# Patient Record
Sex: Male | Born: 1990
Health system: Southern US, Community
[De-identification: ages and names within clinical notes are randomized; demographics above are authoritative.]

## PROBLEM LIST (undated history)

## (undated) HISTORY — PX: KNEE SURGERY: SHX244

## (undated) HISTORY — PX: EYE SURGERY: SHX253

---
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
## 2010-05-19 LAB — CBC
HCT: 41.1 % (ref 39.0–52.0)
Hemoglobin: 14.3 g/dL (ref 13.0–17.0)
MCHC: 34.7 g/dL (ref 30.0–36.0)
MCV: 85.1 fL (ref 78.0–100.0)
Platelets: 178 10*3/uL (ref 150–400)
RBC: 4.84 MIL/uL (ref 4.22–5.81)
RDW: 13.1 % (ref 11.5–15.5)
WBC: 7.3 10*3/uL (ref 4.0–10.5)

## 2010-05-19 LAB — PROTIME-INR
INR: 1.16 (ref 0.00–1.49)
Prothrombin Time: 14.7 seconds (ref 11.6–15.2)

## 2016-06-10 DIAGNOSIS — J0100 Acute maxillary sinusitis, unspecified: Secondary | ICD-10-CM | POA: Diagnosis not present

## 2016-06-10 DIAGNOSIS — J029 Acute pharyngitis, unspecified: Secondary | ICD-10-CM | POA: Diagnosis not present

## 2017-03-26 DIAGNOSIS — J0100 Acute maxillary sinusitis, unspecified: Secondary | ICD-10-CM | POA: Diagnosis not present

## 2018-05-12 ENCOUNTER — Telehealth: Payer: Self-pay | Admitting: Physician Assistant

## 2018-05-12 DIAGNOSIS — M549 Dorsalgia, unspecified: Secondary | ICD-10-CM

## 2018-05-12 DIAGNOSIS — R509 Fever, unspecified: Secondary | ICD-10-CM

## 2018-05-12 NOTE — Progress Notes (Signed)
Based on what you shared with me, I feel your condition warrants further evaluation and I recommend that you be seen for a face to face office visit.  Giving the high fever along with back pain, this is not classic for flu (generalized aches, chills, sore throat, headache with fever). This needs assessment in person. Please do not delay care.     NOTE: If you entered your credit card information for this eVisit, you will not be charged. You may see a "hold" on your card for the $35 but that hold will drop off and you will not have a charge processed.  If you are having a true medical emergency please call 911.  If you need an urgent face to face visit, [place] has four urgent care centers for your convenience.    PLEASE NOTE: THE [facility] LOCATIONS AND URGENT CARE CLINICS DO NOT HAVE THE TESTING FOR CORONAVIRUS COVID19 AVAILABLE.  IF YOU FEEL YOU NEED THIS TEST YOU MUST HAVE AN ORDER TO GO TO A TESTING LOCATION FROM YOUR PROVIDER OR FROM A SCREENING E-VISIT     [email or web address] to reserve your spot online an avoid wait times  [facility] [address] [place], [place] [address] 8 am to 8 pm Monday-Friday 10 am to 4 pm Saturday-Sunday *Across the street from [employer]  [address] [place], [address] 8 am to 5 pm Monday-Friday * In the [facility] on the [facility]   The following sites will take your insurance:  . [facility]  [phone] Get Driving Directions Find a Provider at this Location  [address] [place], [place] [address] . 10 am to 8 pm Monday-Friday . 12 pm to 8 pm Saturday-Sunday   . [facility] at [facility]  [phone] Get Driving Directions Find a Provider at this Location  [address], [address] [place], [place] [address] . 8 am to 8 pm Monday-Friday . 9 am to 6 pm Saturday . 11 am to 6 pm Sunday   . [facility]  Urgent Care at [facility]  [phone] Get Driving Directions  [address] [place], [place] [address] . 8 am to 8 pm Monday-Friday . 8 am to 4 pm Saturday-Sunday   Your e-visit answers were reviewed by a board certified advanced clinical practitioner to complete your personal care plan.  Thank you for using e-Visits.

## 2018-09-23 ENCOUNTER — Encounter (HOSPITAL_BASED_OUTPATIENT_CLINIC_OR_DEPARTMENT_OTHER): Payer: Self-pay

## 2018-09-23 ENCOUNTER — Emergency Department (HOSPITAL_BASED_OUTPATIENT_CLINIC_OR_DEPARTMENT_OTHER)
Admission: EM | Admit: 2018-09-23 | Discharge: 2018-09-23 | Disposition: A | Discharge status: Home / Self Care | Payer: No Typology Code available for payment source | Attending: Emergency Medicine | Admitting: Emergency Medicine

## 2018-09-23 ENCOUNTER — Other Ambulatory Visit: Payer: Self-pay

## 2018-09-23 DIAGNOSIS — Y9389 Activity, other specified: Secondary | ICD-10-CM | POA: Insufficient documentation

## 2018-09-23 DIAGNOSIS — Y990 Civilian activity done for income or pay: Secondary | ICD-10-CM | POA: Diagnosis not present

## 2018-09-23 DIAGNOSIS — Y9289 Other specified places as the place of occurrence of the external cause: Secondary | ICD-10-CM | POA: Insufficient documentation

## 2018-09-23 DIAGNOSIS — M542 Cervicalgia: Secondary | ICD-10-CM | POA: Diagnosis present

## 2018-09-23 MED ORDER — IBUPROFEN 800 MG PO TABS
800.0000 mg | ORAL_TABLET | Freq: Once | ORAL | Status: AC
  Administered 2018-09-23: 800 mg via ORAL
  Filled 2018-09-23: qty 1

## 2018-09-23 MED ORDER — CYCLOBENZAPRINE HCL 10 MG PO TABS: 10.0000 mg | ORAL_TABLET | Freq: Two times a day (BID) | ORAL | 0 refills | Status: AC | PRN
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
## 2020-04-30 ENCOUNTER — Emergency Department (HOSPITAL_COMMUNITY)
Admission: EM | Admit: 2020-04-30 | Discharge: 2020-04-30 | Disposition: A | Discharge status: Home / Self Care | Payer: 59 | Attending: Emergency Medicine | Admitting: Emergency Medicine

## 2020-04-30 ENCOUNTER — Encounter (HOSPITAL_COMMUNITY): Payer: Self-pay

## 2020-04-30 ENCOUNTER — Other Ambulatory Visit: Payer: Self-pay

## 2020-04-30 ENCOUNTER — Emergency Department (HOSPITAL_COMMUNITY): Payer: 59

## 2020-04-30 DIAGNOSIS — N50812 Left testicular pain: Secondary | ICD-10-CM | POA: Diagnosis present

## 2020-04-30 DIAGNOSIS — N433 Hydrocele, unspecified: Secondary | ICD-10-CM | POA: Diagnosis not present

## 2020-04-30 LAB — URINALYSIS, ROUTINE W REFLEX MICROSCOPIC
Bilirubin Urine: NEGATIVE
Glucose, UA: NEGATIVE mg/dL
Hgb urine dipstick: NEGATIVE
Ketones, ur: NEGATIVE mg/dL
Leukocytes,Ua: NEGATIVE
Nitrite: NEGATIVE
Protein, ur: NEGATIVE mg/dL
Specific Gravity, Urine: 1.025 (ref 1.005–1.030)
pH: 7 (ref 5.0–8.0)

## 2020-04-30 NOTE — ED Provider Notes (Signed)
[facility]-EMERGENCY DEPT Provider Note   CSN: [id number] Arrival date & time: 04/30/20  1820     History Chief Complaint  Patient presents with  . Testicle Pain    Steve Sanders is a 30 y.o. male.  HPI Pt is a 30 year old male that presents to the ED due to left testicle pain.  Symptoms started about 6 days ago.  He states that they happen briefly in the evening and then dissipated overnight.  2 days later he had another episode during the day that spontaneously alleviated shortly thereafter.  Earlier today he began experiencing a similar episode that he states has been progressively worsening throughout the day.  He called his regular doctor who recommended that he come to the emergency department for an ultrasound to rule out torsion.  States he is married and in a monogamous relationship.  Denies any concern for STI.  Denies any fevers, chills, dysuria, hematuria, penile discharge, penile pain.  No scrotal swelling.    History reviewed. No pertinent past medical history.  There are no problems to display for this patient.   Past Surgical History:  Procedure Laterality Date  . EYE SURGERY    . KNEE SURGERY         Family History  Problem Relation Age of Onset  . Healthy Mother     Social History   Tobacco Use  . Smoking status: Never Smoker  . Smokeless tobacco: Never Used  Vaping Use  . Vaping Use: Never used  Substance Use Topics  . Alcohol use: Yes    Comment: occ  . Drug use: Never    Home Medications Prior to Admission medications   Medication Sig Start Date End Date Taking? Authorizing Provider  cyclobenzaprine (FLEXERIL) 10 MG tablet Take 1 tablet (10 mg total) by mouth 2 (two) times daily as needed for muscle spasms. 09/23/18   Shanon Ace, PA-C    Allergies    Patient has no known allergies.  Review of Systems   Review of Systems  All other systems reviewed and are negative. Ten systems reviewed and are  negative for acute change, except as noted in the HPI.    Physical Exam Updated Vital Signs BP 127/88   Pulse 72   Temp 98.4 F (36.9 C) (Oral)   Resp 19   Ht 5\' 5"  (1.651 m)   Wt 72.6 kg   SpO2 100%   BMI 26.63 kg/m   Physical Exam Vitals and nursing note reviewed.  Constitutional:      General: He is not in acute distress.    Appearance: He is well-developed.  HENT:     Head: Normocephalic and atraumatic.     Right Ear: External ear normal.     Left Ear: External ear normal.  Eyes:     General: No scleral icterus.       Right eye: No discharge.        Left eye: No discharge.     Conjunctiva/sclera: Conjunctivae normal.  Neck:     Trachea: No tracheal deviation.  Cardiovascular:     Rate and Rhythm: Normal rate.  Pulmonary:     Effort: Pulmonary effort is normal. No respiratory distress.     Breath sounds: No stridor.  Abdominal:     General: There is no distension.  Genitourinary:    Comments: Male nursing chaperone present. Normal appearing circumcised penis.  No tenderness appreciated along the penile shaft.  Mild tenderness diffusely along  the left testicle.  No overlying skin changes or swelling.  No epididymal tenderness noted bilaterally.  No right testicular tenderness. Cremasteric reflex intact. No palpable hernias.  Musculoskeletal:        General: No swelling or deformity.     Cervical back: Neck supple.  Skin:    General: Skin is warm and dry.     Findings: No rash.  Neurological:     Mental Status: He is alert.     Cranial Nerves: Cranial nerve deficit: no gross deficits.    ED Results / Procedures / Treatments   Labs (all labs ordered are listed, but only abnormal results are displayed) Labs Reviewed  URINALYSIS, ROUTINE W REFLEX MICROSCOPIC - Abnormal; Notable for the following components:      Result Value   APPearance CLOUDY (*)    All other components within normal limits  GC/CHLAMYDIA PROBE AMP ([place]) NOT AT [facility]    EKG None  Radiology US SCROTUM W/DOPPLER  Result Date: 04/30/2020 CLINICAL DATA:  Left testicular pain, rule out torsion. EXAM: SCROTAL ULTRASOUND DOPPLER ULTRASOUND OF THE TESTICLES TECHNIQUE: Complete ultrasound examination of the testicles, epididymis, and other scrotal structures was performed. Color and spectral Doppler ultrasound were also utilized to evaluate blood flow to the testicles. COMPARISON:  None. FINDINGS: Right testicle Measurements: 3.5 x 2.4 x 2.4 cm. No mass or microlithiasis visualized. Left testicle Measurements: 3.5 x 2.3 x 2.3 cm. No mass or microlithiasis visualized. Right epididymis:  Normal in size and appearance. Left epididymis:  Normal in size and appearance. Hydrocele: Bilateral small simple hydroceles, right greater than left. Varicocele:  None visualized. Pulsed Doppler interrogation of both testes demonstrates normal low resistance arterial and venous waveforms bilaterally. IMPRESSION: 1. Bilateral small hydroceles. Otherwise unremarkable scrotal ultrasound. 2. Please note testicular torsion is not fully excluded as this can be an intermittent finding. Electronically Signed   By: Tish Frederickson M.D.   On: 04/30/2020 19:55    Procedures Procedures   Medications Ordered in ED Medications - No data to display  ED Course  I have reviewed the triage vital signs and the nursing notes.  Pertinent labs & imaging results that were available during my care of the patient were reviewed by me and considered in my medical decision making (see chart for details).    MDM Rules/Calculators/A&P                          Pt is a 30 y.o. male who presents to the ED with left testicle pain.   Imaging: U/S with doppler of the scrotum shows small bilateral hydroceles, right greater than left. Otherwise, unremarkable exam.   I, Placido Sou, PA-C, personally reviewed and evaluated these images as part of my medical decision-making.  Unsure of the cause of the  patient's pain. U/S reassuring. Patient was having his worst pain while in the ED and during the sonographers exam, so doubt this was torsion that momentarily resolved while in the ED. He does have small uncomplicated bilateral hydroceles. He had very mild TTP to the left testicle but otherwise had a normal appearing exam. UA reassuring. GC/chlamydia pending but pt in monogamous relationship and denies any urinary sx, GU sx, or concern for STI. Doubt his pain is 2/2 infection. Recommended he try wearing more supportive underwear for the next few weeks to see if this helps with his sx. He is going to f/u with his PCP and also has a urology appt  later this month. He knows to return to the ED with worsening sx. His questions were answered and he was amicable at the time of d/c.   Note: Portions of this report may have been transcribed using voice recognition software. Every effort was made to ensure accuracy; however, inadvertent computerized transcription errors may be present.   Final Clinical Impression(s) / ED Diagnoses Final diagnoses:  Left testicular pain  Hydrocele, unspecified hydrocele type   Rx / DC Orders ED Discharge Orders    None       Placido Sou, PA-C 05/01/20 1039    Bethann Berkshire, MD 05/02/20 0945

## 2020-04-30 NOTE — Discharge Instructions (Signed)
Like we discussed, please consider wearing more supportive underwear for the next 1 to 2 weeks to see if this helps alleviate your symptoms.  If your symptoms persist, please follow-up with your regular doctor.  Also, please make sure that you follow-up with urology later this month.  If your symptoms worsen, please return to the emergency department immediately for reevaluation.  It was a pleasure to meet you.

## 2020-04-30 NOTE — ED Triage Notes (Signed)
Patient c/o left testicle pain x 4 days and worse today. Patient denies any swelling

## 2020-05-02 LAB — GC/CHLAMYDIA PROBE AMP (~~LOC~~) NOT AT ARMC
Chlamydia: NEGATIVE
Comment: NEGATIVE
Comment: NORMAL
Neisseria Gonorrhea: NEGATIVE

## 2020-12-26 ENCOUNTER — Encounter (HOSPITAL_COMMUNITY): Payer: Self-pay

## 2022-04-04 ENCOUNTER — Emergency Department
Admission: EM | Admit: 2022-04-04 | Discharge: 2022-04-04 | Disposition: A | Discharge status: Home / Self Care | Payer: 59 | Attending: Student in an Organized Health Care Education/Training Program | Admitting: Student in an Organized Health Care Education/Training Program

## 2022-04-04 ENCOUNTER — Emergency Department: Payer: 59

## 2022-04-04 DIAGNOSIS — S0990XA Unspecified injury of head, initial encounter: Secondary | ICD-10-CM | POA: Diagnosis present

## 2022-04-04 DIAGNOSIS — W01198A Fall on same level from slipping, tripping and stumbling with subsequent striking against other object, initial encounter: Secondary | ICD-10-CM | POA: Diagnosis not present
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
## 2022-04-04 NOTE — Discharge Instructions (Signed)
Alternate Tylenol and ibuprofen as needed for headache and neck discomfort.
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
## 2022-10-04 IMAGING — US US SCROTUM W/ DOPPLER COMPLETE
1 series · 14 of 25 positions shown · non-contrast
Comparison: None.

CLINICAL DATA: Left testicular pain, rule out torsion.

EXAM:
SCROTAL ULTRASOUND
DOPPLER ULTRASOUND OF THE TESTICLES
TECHNIQUE: Complete ultrasound examination of the testicles, epididymis, and
other scrotal structures was performed. Color and spectral Doppler
ultrasound were also utilized to evaluate blood flow to the
testicles.

[Series 1: us scrotum w/ doppler complete · 14 of 53 slices shown]
[im 1/53]
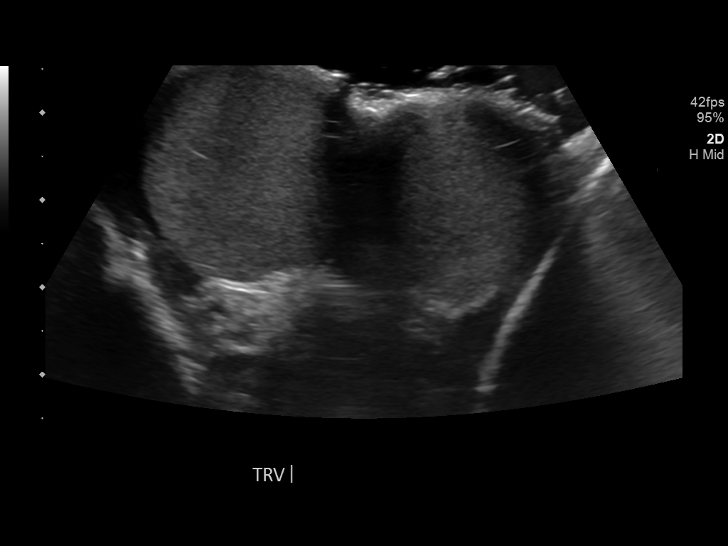
[im 5/53]
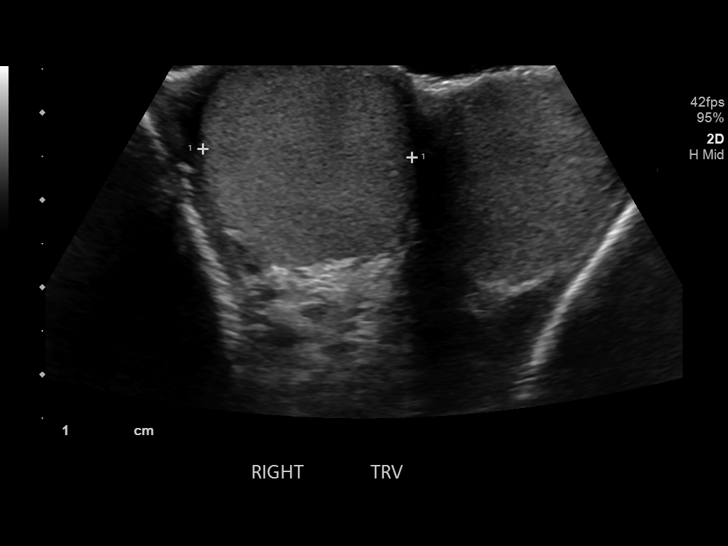
[im 9/53]
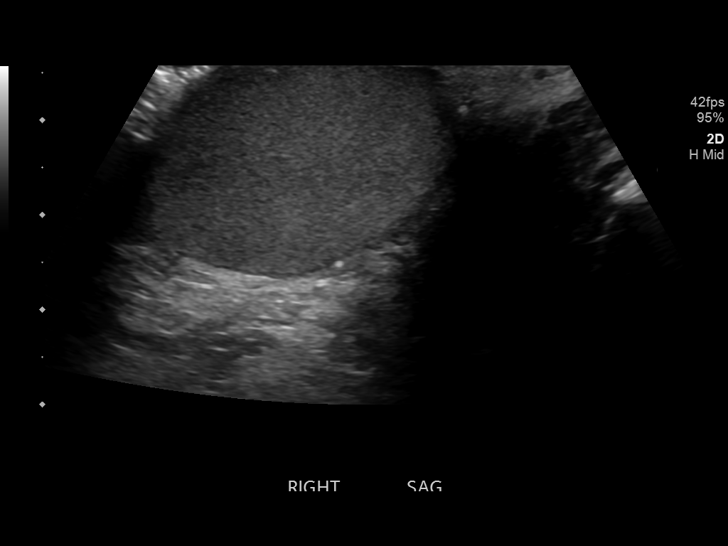
[im 14/53]
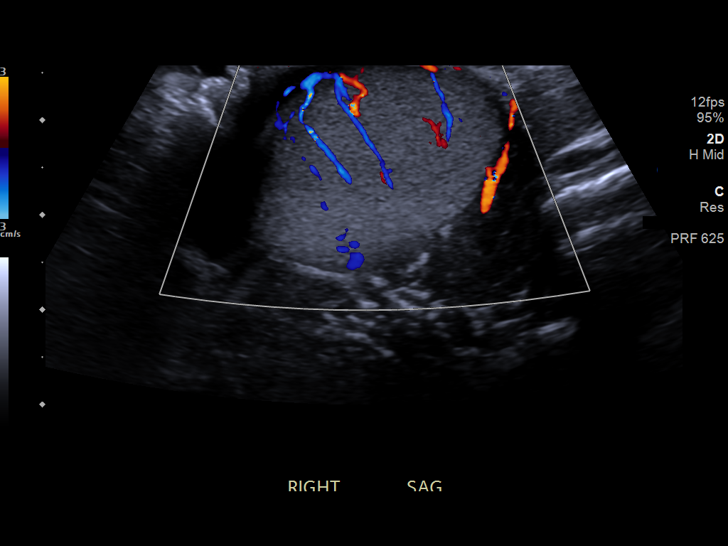
[im 18/53]
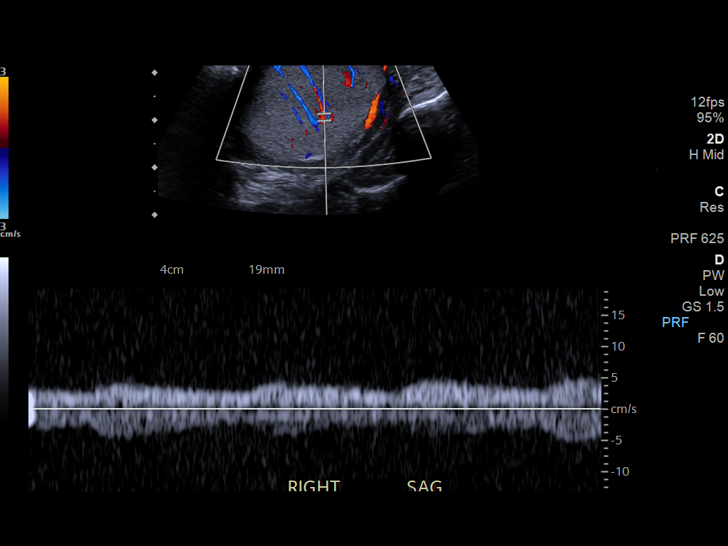
[im 20/53]
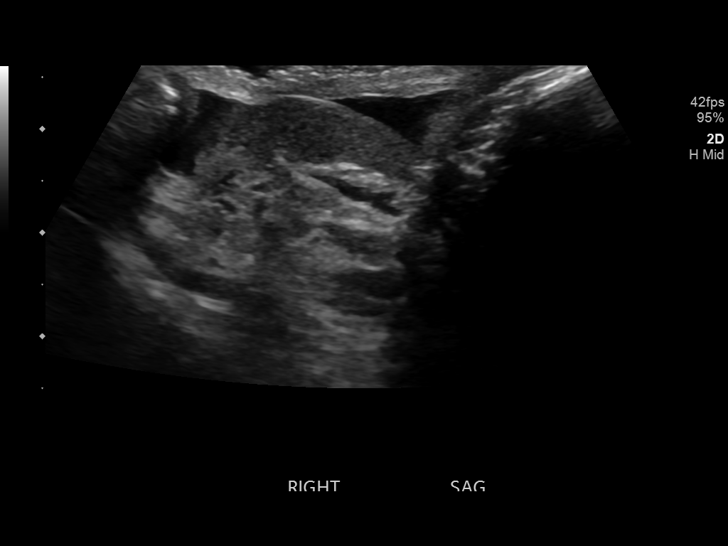
[im 24/53]
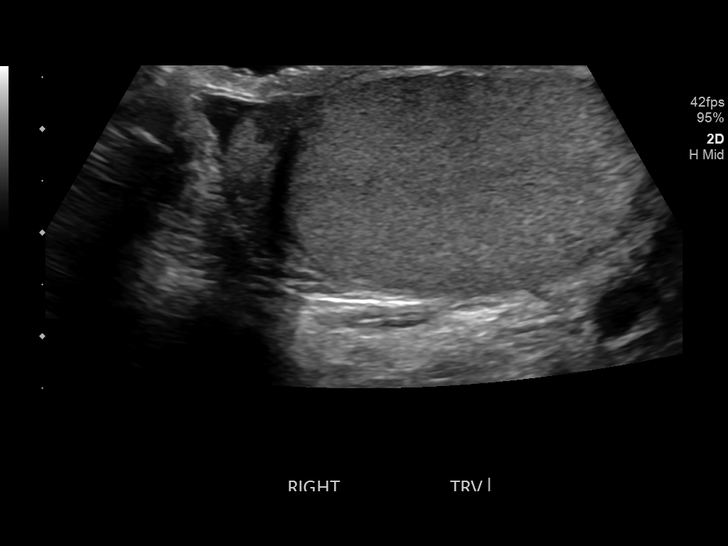
[im 29/53]
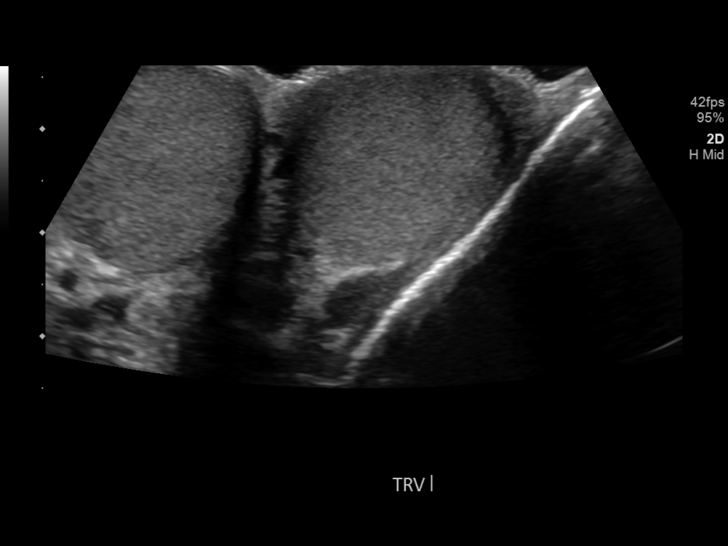
[im 33/53]
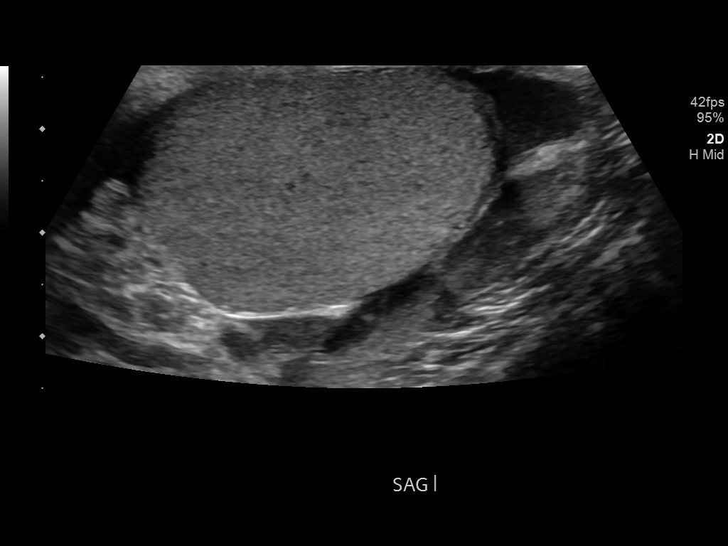
[im 35/53]
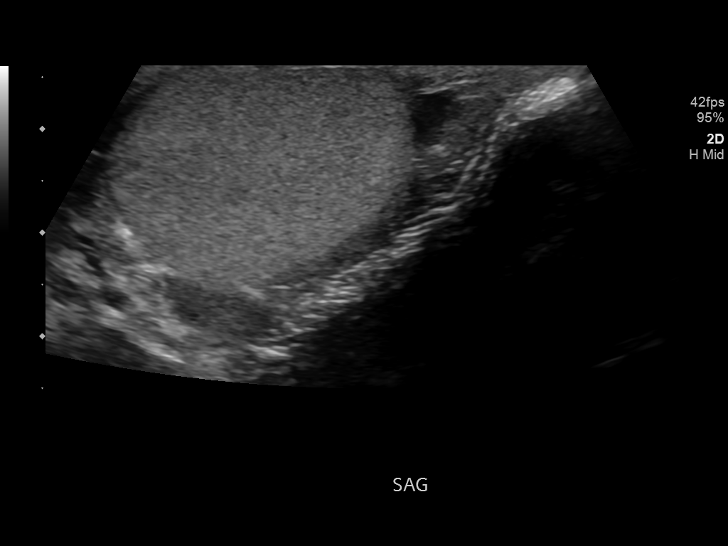
[im 40/53]
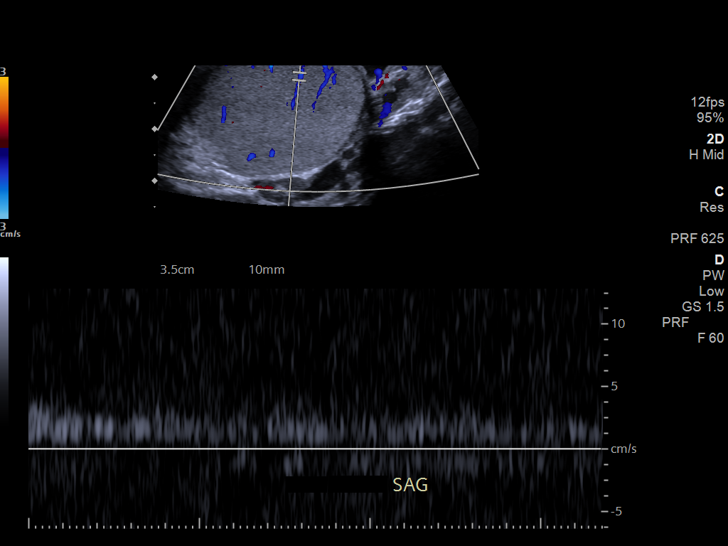
[im 44/53]
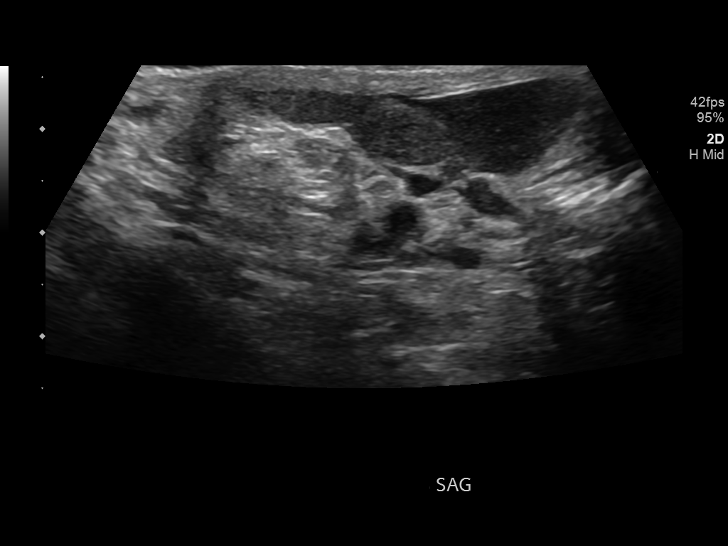
[im 48/53]
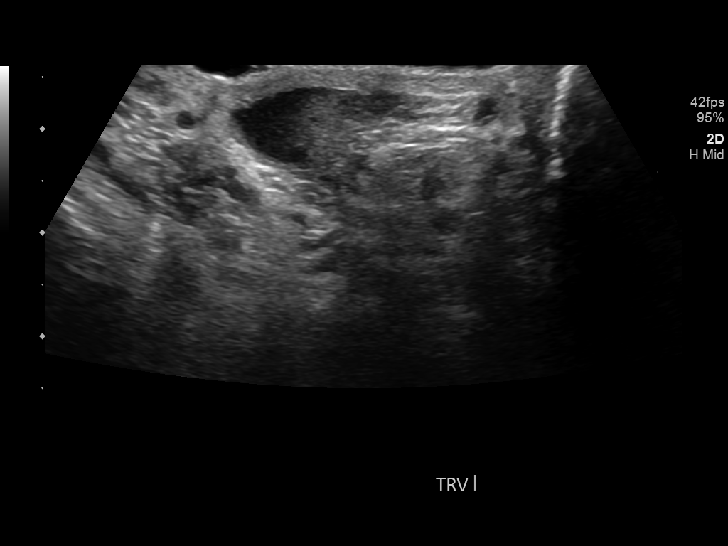
[im 53/53]
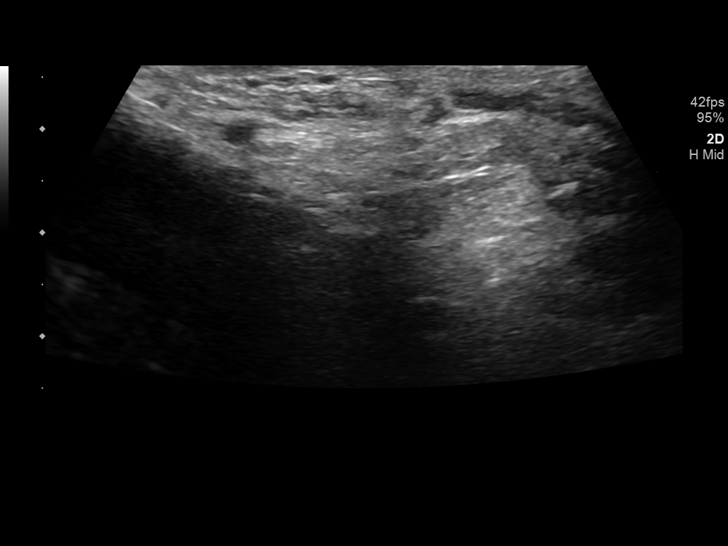

[14 of 25 positions shown; findings below may reference images not displayed]

FINDINGS: Right testicle

Measurements: 3.5 x 2.4 x 2.4 cm. No mass or microlithiasis
visualized.

Left testicle

Measurements: 3.5 x 2.3 x 2.3 cm. No mass or microlithiasis
visualized.

Right epididymis:  Normal in size and appearance.

Left epididymis:  Normal in size and appearance.

Hydrocele: Bilateral small simple hydroceles, right greater than
left.

Varicocele:  None visualized.

Pulsed Doppler interrogation of both testes demonstrates normal low
resistance arterial and venous waveforms bilaterally.
IMPRESSION: 1. Bilateral small hydroceles. Otherwise unremarkable scrotal
ultrasound.
2. Please note testicular torsion is not fully excluded as this can
be an intermittent finding.
